# Patient Record
Sex: Male | Born: 1970 | Race: White | Smoking: Former smoker
Health system: Southern US, Community
[De-identification: ages and names within clinical notes are randomized; demographics above are authoritative.]

## PROBLEM LIST (undated history)

## (undated) DIAGNOSIS — F909 Attention-deficit hyperactivity disorder, unspecified type: Secondary | ICD-10-CM

## (undated) HISTORY — DX: Attention-deficit hyperactivity disorder, unspecified type: F90.9

---

## 2015-01-14 ENCOUNTER — Ambulatory Visit (INDEPENDENT_AMBULATORY_CARE_PROVIDER_SITE_OTHER): Payer: 59 | Admitting: Psychology

## 2015-01-14 DIAGNOSIS — F4310 Post-traumatic stress disorder, unspecified: Secondary | ICD-10-CM

## 2015-01-23 ENCOUNTER — Ambulatory Visit (INDEPENDENT_AMBULATORY_CARE_PROVIDER_SITE_OTHER): Payer: 59 | Admitting: Psychology

## 2015-01-23 DIAGNOSIS — F4310 Post-traumatic stress disorder, unspecified: Secondary | ICD-10-CM | POA: Diagnosis not present

## 2015-02-07 ENCOUNTER — Ambulatory Visit (INDEPENDENT_AMBULATORY_CARE_PROVIDER_SITE_OTHER): Payer: 59 | Admitting: Psychology

## 2015-02-07 DIAGNOSIS — F908 Attention-deficit hyperactivity disorder, other type: Secondary | ICD-10-CM | POA: Diagnosis not present

## 2015-02-07 DIAGNOSIS — F3340 Major depressive disorder, recurrent, in remission, unspecified: Secondary | ICD-10-CM

## 2015-02-22 ENCOUNTER — Ambulatory Visit (INDEPENDENT_AMBULATORY_CARE_PROVIDER_SITE_OTHER): Payer: 59 | Admitting: Psychology

## 2015-02-22 DIAGNOSIS — F3342 Major depressive disorder, recurrent, in full remission: Secondary | ICD-10-CM | POA: Diagnosis not present

## 2015-02-22 DIAGNOSIS — F1020 Alcohol dependence, uncomplicated: Secondary | ICD-10-CM

## 2015-02-22 DIAGNOSIS — F902 Attention-deficit hyperactivity disorder, combined type: Secondary | ICD-10-CM | POA: Diagnosis not present

## 2015-02-22 DIAGNOSIS — F602 Antisocial personality disorder: Secondary | ICD-10-CM

## 2015-10-03 ENCOUNTER — Ambulatory Visit (INDEPENDENT_AMBULATORY_CARE_PROVIDER_SITE_OTHER): Payer: 59 | Admitting: Psychology

## 2015-10-03 DIAGNOSIS — F4310 Post-traumatic stress disorder, unspecified: Secondary | ICD-10-CM | POA: Diagnosis not present

## 2015-10-29 ENCOUNTER — Encounter: Payer: Self-pay | Admitting: Family

## 2015-10-29 ENCOUNTER — Ambulatory Visit (INDEPENDENT_AMBULATORY_CARE_PROVIDER_SITE_OTHER): Payer: 59 | Admitting: Family

## 2015-10-29 VITALS — BP 118/82 | HR 83 | Temp 98.7°F | Resp 18 | Ht 72.0 in | Wt 163.4 lb

## 2015-10-29 DIAGNOSIS — F909 Attention-deficit hyperactivity disorder, unspecified type: Secondary | ICD-10-CM | POA: Insufficient documentation

## 2015-10-29 DIAGNOSIS — F902 Attention-deficit hyperactivity disorder, combined type: Secondary | ICD-10-CM

## 2015-10-29 DIAGNOSIS — F17200 Nicotine dependence, unspecified, uncomplicated: Secondary | ICD-10-CM | POA: Diagnosis not present

## 2015-10-29 DIAGNOSIS — M7989 Other specified soft tissue disorders: Secondary | ICD-10-CM | POA: Insufficient documentation

## 2015-10-29 MED ORDER — AMPHETAMINE-DEXTROAMPHETAMINE 5 MG PO TABS: 5.0000 mg | ORAL_TABLET | Freq: Every day | ORAL | Status: DC

## 2015-10-29 NOTE — Assessment & Plan Note (Signed)
Discussed continued use of tobacco and increased risk for cardiovascular and pulmonary disease. Not ready to quit at this time. Did receive motivation finding out he has a significant family history of cancer. Continue to monitor and follow up for additional counseling with annual visit.

## 2015-10-29 NOTE — Assessment & Plan Note (Signed)
Symptoms consistent with soft tissue cyst most likely benign, however there is concern for underlying pathology. Obtain ultrasound. Continue to monitor pending ultrasound. Follow up if symptoms worsen.

## 2015-10-29 NOTE — Assessment & Plan Note (Signed)
Recently diagnosed with ADHD through [facility]. Discussed risks and benefits of stimulant and non-stimulant medications. Start Adderall. [place] Controlled Substance Database reviewed with no irregularities. Follow up in 1 month. Due for UDS and Controlled Substance Contract.

## 2015-10-29 NOTE — Progress Notes (Signed)
Subjective:    Patient ID: Eugene Ryan, male    DOB: 07/01/1971, 45 y.o.   MRN: [id number]  Chief Complaint  Patient presents with  . Establish Care    cyst, ADHD    HPI:  Eugene Ryan is a 45 y.o. male who  has a past medical history of ADHD (attention deficit hyperactivity disorder). and presents today for an office visit to establish care.  1.) Cyst - Associated symptom of a cyst located on his upper right quadrant has been going on for about 10 years. Describes that it is sitting on his rib cage and has noted some growth over the last several months with pressure and is sensitive to touch. Pain is described as dull. Previously seen about 10 years ago and was instructed to monitor.   2.) ADHD - Last year completed ADHD testing through [facility] and found to have ADHD. Currently working with Dr. Cheryln Manly. Describes trouble focusing on his job and completing tasks. Notes that his mind is constantly wandering. Tends to change tasks before completing the task. Has tried another person's prescription of Adderall.    No Known Allergies   No outpatient prescriptions prior to visit.   No facility-administered medications prior to visit.     Past Medical History  Diagnosis Date  . ADHD (attention deficit hyperactivity disorder)      History reviewed. No pertinent past surgical history.   Family History  Problem Relation Age of Onset  . Adopted: Yes  . Schizophrenia Mother   . Post-traumatic stress disorder Father   . Lung cancer Father      Social History   Social History  . Marital Status: Married    Spouse Name: N/A  . Number of Children: 1  . Years of Education: 14   Occupational History  . Media planner    Social History Main Topics  . Smoking status: Current Every Day Smoker -- 0.75 packs/day for 26 years    Types: Cigarettes  . Smokeless tobacco: Never Used  . Alcohol Use: 7.2 oz/week    0 Standard drinks or equivalent, 12 Cans of  beer per week  . Drug Use: No  . Sexual Activity: Not on file   Other Topics Concern  . Not on file   Social History Narrative     Review of Systems  Constitutional: Negative for fever, chills and unexpected weight change.  Respiratory: Positive for chest tightness. Negative for cough and shortness of breath.   Skin:       Positive for a cyst  Psychiatric/Behavioral: Positive for decreased concentration. Negative for suicidal ideas, confusion, sleep disturbance and dysphoric mood. The patient is not hyperactive.       Objective:    BP 118/82 mmHg  Pulse 83  Temp(Src) 98.7 F (37.1 C) (Oral)  Resp 18  Ht 6' (1.829 m)  Wt 163 lb 6.4 oz (74.118 kg)  BMI 22.16 kg/m2  SpO2 97% Nursing note and vital signs reviewed.  Physical Exam  Constitutional: He is oriented to person, place, and time. He appears well-developed and well-nourished. No distress.  Cardiovascular: Normal rate, regular rhythm, normal heart sounds and intact distal pulses.   Pulmonary/Chest: Effort normal and breath sounds normal.    Neurological: He is alert and oriented to person, place, and time.  Skin: Skin is warm and dry.  Psychiatric: He has a normal mood and affect. His behavior is normal. Judgment and thought content normal.       Assessment &  Plan:   Problem List Items Addressed This Visit      Other   Attention deficit hyperactivity disorder (ADHD) - Primary    Recently diagnosed with ADHD through [place] [facility]. Discussed risks and benefits of stimulant and non-stimulant medications. Start Adderall. [place] Controlled Substance Database reviewed with no irregularities. Follow up in 1 month. Due for UDS and Controlled Substance Contract.       Cyst of soft tissue    Symptoms consistent with soft tissue cyst most likely benign, however there is concern for underlying pathology. Obtain ultrasound. Continue to monitor pending ultrasound. Follow up if symptoms worsen.       Relevant Orders   [place]  Chest   Tobacco use disorder    Discussed continued use of tobacco and increased risk for cardiovascular and pulmonary disease. Not ready to quit at this time. Did receive motivation finding out he has a significant family history of cancer. Continue to monitor and follow up for additional counseling with annual visit.

## 2015-10-29 NOTE — Progress Notes (Signed)
Pre visit review using our clinic review tool, if applicable. No additional management support is needed unless otherwise documented below in the visit note. 

## 2015-10-29 NOTE — Patient Instructions (Signed)
Thank you for choosing [facility].  Summary/Instructions:  Your prescription(s) have been submitted to your pharmacy or been printed and provided for you. Please take as directed and contact our office if you believe you are having problem(s) with the medication(s) or have any questions.  If your symptoms worsen or fail to improve, please contact our office for further instruction, or in case of emergency go directly to the emergency room at the closest medical facility.    Attention Deficit Hyperactivity Disorder Attention deficit hyperactivity disorder (ADHD) is a problem with behavior issues based on the way the brain functions (neurobehavioral disorder). It is a common reason for behavior and academic problems in school. SYMPTOMS  There are 3 types of ADHD. The 3 types and some of the symptoms include:  Inattentive.  Gets bored or distracted easily.  Loses or forgets things. Forgets to hand in homework.  Has trouble organizing or completing tasks.  Difficulty staying on task.  An inability to organize daily tasks and school work.  Leaving projects, chores, or homework unfinished.  Trouble paying attention or responding to details. Careless mistakes.  Difficulty following directions. Often seems like is not listening.  Dislikes activities that require sustained attention (like chores or homework).  Hyperactive-impulsive.  Feels like it is impossible to sit still or stay in a seat. Fidgeting with hands and feet.  Trouble waiting turn.  Talking too much or out of turn. Interruptive.  Speaks or acts impulsively.  Aggressive, disruptive behavior.  Constantly busy or on the go; noisy.  Often leaves seat when they are expected to remain seated.  Often runs or climbs where it is not appropriate, or feels very restless.  Combined.  Has symptoms of both of the above. Often children with ADHD feel discouraged about themselves and with school. They often perform  well below their abilities in school. As children get older, the excess motor activities can calm down, but the problems with paying attention and staying organized persist. Most children do not outgrow ADHD but with good treatment can learn to cope with the symptoms. DIAGNOSIS  When ADHD is suspected, the diagnosis should be made by professionals trained in ADHD. This professional will collect information about the individual suspected of having ADHD. Information must be collected from various settings where the person lives, works, or attends school.  Diagnosis will include:  Confirming symptoms began in childhood.  Ruling out other reasons for the child's behavior.  The health care providers will check with the child's school and check their medical records.  They will talk to teachers and parents.  Behavior rating scales for the child will be filled out by those dealing with the child on a daily basis. A diagnosis is made only after all information has been considered. TREATMENT  Treatment usually includes behavioral treatment, tutoring or extra support in school, and stimulant medicines. Because of the way a person's brain works with ADHD, these medicines decrease impulsivity and hyperactivity and increase attention. This is different than how they would work in a person who does not have ADHD. Other medicines used include antidepressants and certain blood pressure medicines. Most experts agree that treatment for ADHD should address all aspects of the person's functioning. Along with medicines, treatment should include structured classroom management at school. Parents should reward good behavior, provide constant discipline, and set limits. Tutoring should be available for the child as needed. ADHD is a lifelong condition. If untreated, the disorder can have long-term serious effects into adolescence and adulthood.  HOME CARE INSTRUCTIONS   Often with ADHD there is a lot of frustration  among family members dealing with the condition. Blame and anger are also feelings that are common. In many cases, because the problem affects the family as a whole, the entire family may need help. A therapist can help the family find better ways to handle the disruptive behaviors of the person with ADHD and promote change. If the person with ADHD is young, most of the therapist's work is with the parents. Parents will learn techniques for coping with and improving their child's behavior. Sometimes only the child with the ADHD needs counseling. Your health care providers can help you make these decisions.  Children with ADHD may need help learning how to organize. Some helpful tips include:  Keep routines the same every day from wake-up time to bedtime. Schedule all activities, including homework and playtime. Keep the schedule in a place where the person with ADHD will often see it. Jafeth schedule changes as far in advance as possible.  Schedule outdoor and indoor recreation.  Have a place for everything and keep everything in its place. This includes clothing, backpacks, and school supplies.  Encourage writing down assignments and bringing home needed books. Work with your child's teachers for assistance in organizing school work.  Offer your child a well-balanced diet. Breakfast that includes a balance of whole grains, protein, and fruits or vegetables is especially important for school performance. Children should avoid drinks with caffeine including:  Soft drinks.  Coffee.  Tea.  However, some older children (adolescents) may find these drinks helpful in improving their attention. Because it can also be common for adolescents with ADHD to become addicted to caffeine, talk with your health care provider about what is a safe amount of caffeine intake for your child.  Children with ADHD need consistent rules that they can understand and follow. If rules are followed, give small rewards.  Children with ADHD often receive, and expect, criticism. Look for good behavior and praise it. Set realistic goals. Give clear instructions. Look for activities that can foster success and self-esteem. Make time for pleasant activities with your child. Give lots of affection.  Parents are their children's greatest advocates. Learn as much as possible about ADHD. This helps you become a stronger and better advocate for your child. It also helps you educate your child's teachers and instructors if they feel inadequate in these areas. Parent support groups are often helpful. A national group with local chapters is called Children and Adults with Attention Deficit Hyperactivity Disorder (CHADD). SEEK MEDICAL CARE IF:  Your child has repeated muscle twitches, cough, or speech outbursts.  Your child has sleep problems.  Your child has a marked loss of appetite.  Your child develops depression.  Your child has new or worsening behavioral problems.  Your child develops dizziness.  Your child has a racing heart.  Your child has stomach pains.  Your child develops headaches. SEEK IMMEDIATE MEDICAL CARE IF:  Your child has been diagnosed with depression or anxiety and the symptoms seem to be getting worse.  Your child has been depressed and suddenly appears to have increased energy or motivation.  You are worried that your child is having a bad reaction to a medication he or she is taking for ADHD.   This information is not intended to replace advice given to you by your health care provider. Make sure you discuss any questions you have with your health care provider.   Document  Released: 08/28/2002 Document Revised: 09/12/2013 Document Reviewed: 05/15/2013 Elsevier Interactive Patient Education 2016 Elsevier Inc.  Amphetamine; Dextroamphetamine tablets What is this medicine? AMPHETAMINE; DEXTROAMPHETAMINE(am FET a meen; dex troe am FET a meen) is used to treat attention-deficit  hyperactivity disorder (ADHD). It may also be used for narcolepsy. Federal law prohibits giving this medicine to any person other than the person for whom it was prescribed. Do not share this medicine with anyone else. This medicine may be used for other purposes; ask your health care provider or pharmacist if you have questions. What should I tell my health care provider before I take this medicine? They need to know if you have any of these conditions: -anxiety or panic attacks -circulation problems in fingers and toes -glaucoma -hardening or blockages of the arteries or heart blood vessels -heart disease or a heart defect -high blood pressure -history of a drug or alcohol abuse problem -history of stroke -kidney disease -liver disease -mental illness -seizures -suicidal thoughts, plans, or attempt; a previous suicide attempt by you or a family member -thyroid disease -Tourette's syndrome -an unusual or allergic reaction to dextroamphetamine, other amphetamines, other medicines, foods, dyes, or preservatives -pregnant or trying to get pregnant -breast-feeding How should I use this medicine? Take this medicine by mouth with a glass of water. Follow the directions on the prescription label. Take your doses at regular intervals. Do not take your medicine more often than directed. Do not suddenly stop your medicine. You must gradually reduce the dose or you may feel withdrawal effects. Ask your doctor or health care professional for advice. Talk to your pediatrician regarding the use of this medicine in children. Special care may be needed. While this drug may be prescribed for children as young as 3 years for selected conditions, precautions do apply. Overdosage: If you think you have taken too much of this medicine contact a poison control center or emergency room at once. NOTE: This medicine is only for you. Do not share this medicine with others. What if I miss a dose? If you miss a  dose, take it as soon as you can. If it is almost time for your next dose, take only that dose. Do not take double or extra doses. What may interact with this medicine? Do not take this medicine with any of the following medications: -MAOIS like Carbex, Eldepryl, Marplan, Nardil, and Parnate -other stimulant medicines for attention disorders, weight loss, or to stay awake This medicine may also interact with the following medications: -acetazolamide -ammonium chloride -antacids -ascorbic acid -atomoxetine -caffeine -certain medicines for blood pressure -certain medicines for depression, anxiety, or psychotic disturbances -certain medicines for seizures like carbamazepine, phenobarbital, phenytoin -certain medicines for stomach problems like cimetidine, famotidine, omeprazole, lansoprazole -cold or allergy medicines -glutamic acid -lithium -meperidine -methenamine; sodium acid phosphate -narcotic medicines for pain -norepinephrine -phenothiazines like chlorpromazine, mesoridazine, prochlorperazine, thioridazine -sodium acid phosphate -sodium bicarbonate This list may not describe all possible interactions. Give your health care provider a list of all the medicines, herbs, non-prescription drugs, or dietary supplements you use. Also tell them if you smoke, drink alcohol, or use illegal drugs. Some items may interact with your medicine. What should I watch for while using this medicine? Visit your doctor or health care professional for regular checks on your progress. This prescription requires that you follow special procedures with your doctor and pharmacy. You will need to have a new written prescription from your doctor every time you need a refill. This medicine may affect  your concentration, or hide signs of tiredness. Until you know how this medicine affects you, do not drive, ride a bicycle, use machinery, or do anything that needs mental alertness. Tell your doctor or health care  professional if this medicine loses its effects, or if you feel you need to take more than the prescribed amount. Do not change the dosage without talking to your doctor or health care professional. Decreased appetite is a common side effect when starting this medicine. Eating small, frequent meals or snacks can help. Talk to your doctor if you continue to have poor eating habits. Height and weight growth of a child taking this medicine will be monitored closely. Do not take this medicine close to bedtime. It may prevent you from sleeping. If you are going to need surgery, a MRI, CT scan, or other procedure, tell your doctor that you are taking this medicine. You may need to stop taking this medicine before the procedure. Tell your doctor or healthcare professional right away if you notice unexplained wounds on your fingers and toes while taking this medicine. You should also tell your healthcare provider if you experience numbness or pain, changes in the skin color, or sensitivity to temperature in your fingers or toes. What side effects may I notice from receiving this medicine? Side effects that you should report to your doctor or health care professional as soon as possible: -allergic reactions like skin rash, itching or hives, swelling of the face, lips, or tongue -changes in vision -chest pain or chest tightness -confusion, trouble speaking or understanding -fast, irregular heartbeat -fingers or toes feel numb, cool, painful -hallucination, loss of contact with reality -high blood pressure -males: prolonged or painful erection -seizures -severe headaches -shortness of breath -suicidal thoughts or other mood changes -trouble walking, dizziness, loss of balance or coordination -uncontrollable head, mouth, neck, arm, or leg movements Side effects that usually do not require medical attention (report to your doctor or health care professional if they continue or are  bothersome): -anxious -headache -loss of appetite -nausea, vomiting -trouble sleeping -weight loss This list may not describe all possible side effects. Call your doctor for medical advice about side effects. You may report side effects to FDA at 1-800-FDA-1088. Where should I keep my medicine? Keep out of the reach of children. This medicine can be abused. Keep your medicine in a safe place to protect it from theft. Do not share this medicine with anyone. Selling or giving away this medicine is dangerous and against the law. Store at room temperature between 15 and 30 degrees C (59 and 86 degrees F). Keep container tightly closed. Throw away any unused medicine after the expiration date. Dispose of properly. This medicine may cause accidental overdose and death if it is taken by other adults, children, or pets. Mix any unused medicine with a substance like cat litter or coffee grounds. Then throw the medicine away in a sealed container like a sealed bag or a coffee can with a lid. Do not use the medicine after the expiration date. NOTE: This sheet is a summary. It may not cover all possible information. If you have questions about this medicine, talk to your doctor, pharmacist, or health care provider.    2016, Elsevier/Gold Standard. (2014-07-11 18:44:41)

## 2015-11-19 ENCOUNTER — Ambulatory Visit
Admission: RE | Admit: 2015-11-19 | Discharge: 2015-11-19 | Disposition: A | Discharge status: Home / Self Care | Payer: 59 | Source: Ambulatory Visit | Attending: Family | Admitting: Family

## 2015-11-19 DIAGNOSIS — R222 Localized swelling, mass and lump, trunk: Secondary | ICD-10-CM | POA: Diagnosis not present

## 2015-11-19 DIAGNOSIS — M7989 Other specified soft tissue disorders: Secondary | ICD-10-CM

## 2015-11-20 ENCOUNTER — Telehealth: Payer: Self-pay | Admitting: Family

## 2015-11-20 DIAGNOSIS — M7989 Other specified soft tissue disorders: Secondary | ICD-10-CM

## 2015-11-20 NOTE — Telephone Encounter (Signed)
Please inform patient that his ultrasound shows a solid mass with no cystic abnormality identified. Radiologist recommends follow-up with MRI to further investigate. If he is interested we'll place the order for the MRI.

## 2015-11-21 NOTE — Telephone Encounter (Signed)
LVM for pt to call back as soon as possible.   RE: results.  

## 2015-11-22 NOTE — Telephone Encounter (Signed)
Pt aware of results. Does wants and MRI done. Please advise on which MRI

## 2015-11-25 NOTE — Telephone Encounter (Signed)
Pt is aware.  

## 2015-11-25 NOTE — Telephone Encounter (Signed)
MRI order will be placed and will need to stop by the lab to check his kidney function prior to the test. Can be done at any time and no fasting needed.

## 2015-11-25 NOTE — Addendum Note (Signed)
Addended by: Mauricio Po D on: 11/25/2015 12:58 PM   Modules accepted: Orders

## 2015-12-03 ENCOUNTER — Inpatient Hospital Stay: Admission: RE | Admit: 2015-12-03 | Payer: 59 | Source: Ambulatory Visit

## 2015-12-11 ENCOUNTER — Telehealth: Payer: Self-pay | Admitting: Family

## 2015-12-11 ENCOUNTER — Ambulatory Visit
Admission: RE | Admit: 2015-12-11 | Discharge: 2015-12-11 | Disposition: A | Discharge status: Home / Self Care | Payer: 59 | Source: Ambulatory Visit | Attending: Family | Admitting: Family

## 2015-12-11 DIAGNOSIS — R222 Localized swelling, mass and lump, trunk: Secondary | ICD-10-CM | POA: Diagnosis not present

## 2015-12-11 DIAGNOSIS — M7989 Other specified soft tissue disorders: Secondary | ICD-10-CM

## 2015-12-11 MED ORDER — GADOBENATE DIMEGLUMINE 529 MG/ML IV SOLN
12.0000 mL | Freq: Once | INTRAVENOUS | Status: AC | PRN
  Administered 2015-12-11: 12 mL via INTRAVENOUS

## 2015-12-11 NOTE — Telephone Encounter (Signed)
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 No further treatment is needed at this time and he can follow up for any changes in size.

## 2015-12-13 NOTE — Telephone Encounter (Signed)
Pt aware of results 

## 2017-08-30 IMAGING — US US CHEST/MEDIASTINUM
1 series · 6 of 6 positions shown · non-contrast
Comparison: None.

CLINICAL DATA: Right ribs cyst.

EXAM:
CHEST ULTRASOUND

[Series 1: us chest/mediastinum · 0.05mm/px · 6 of 6 slices shown]
[im 1/6]
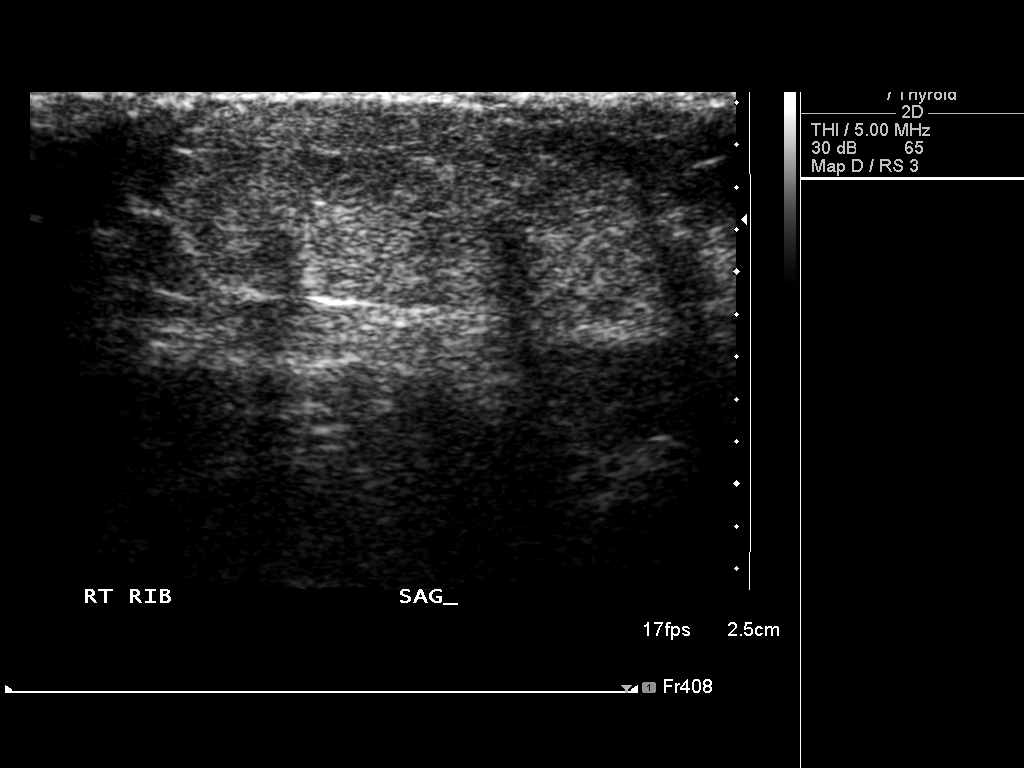
[im 2/6]
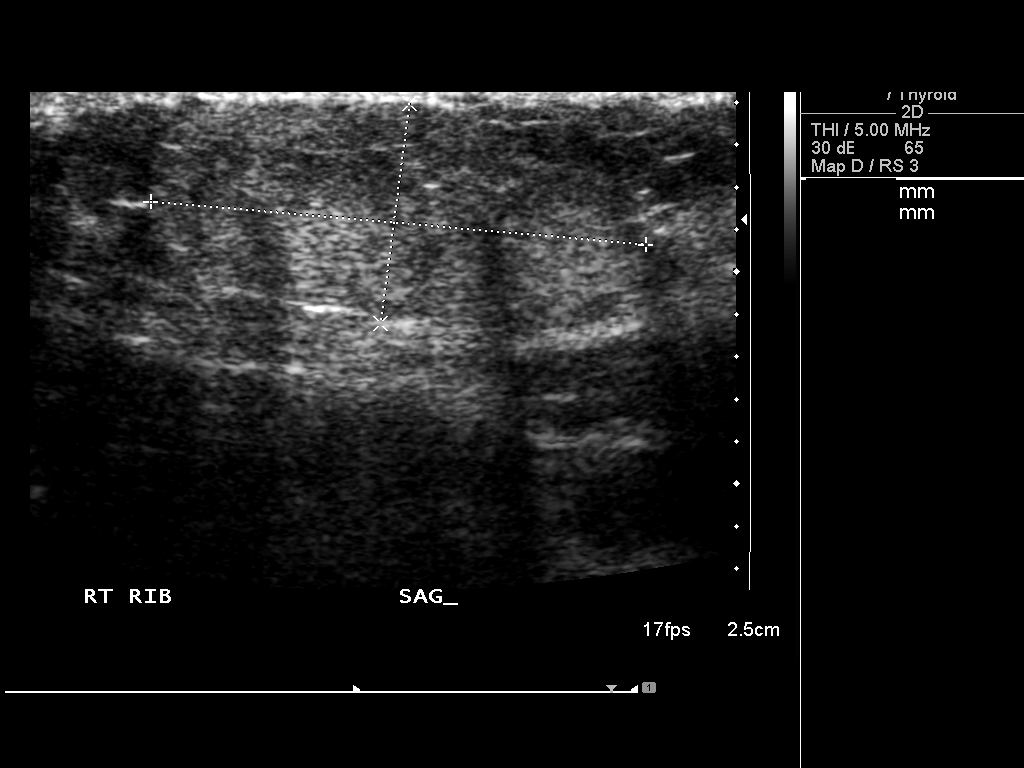
[im 3/6]
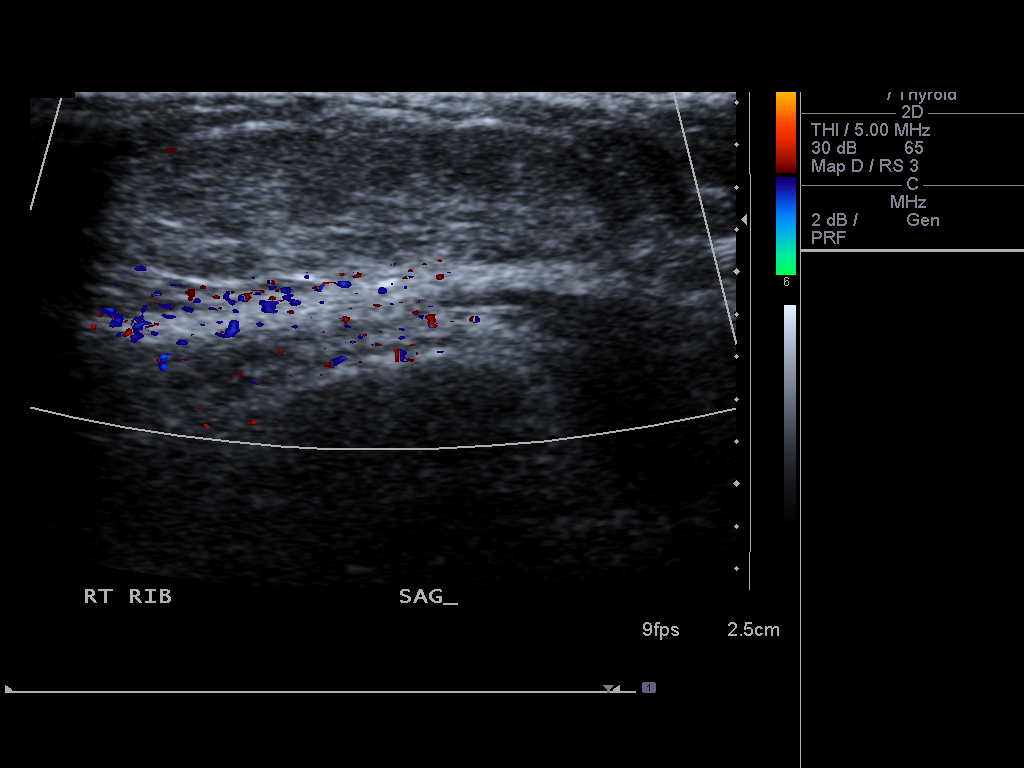
[im 4/6]
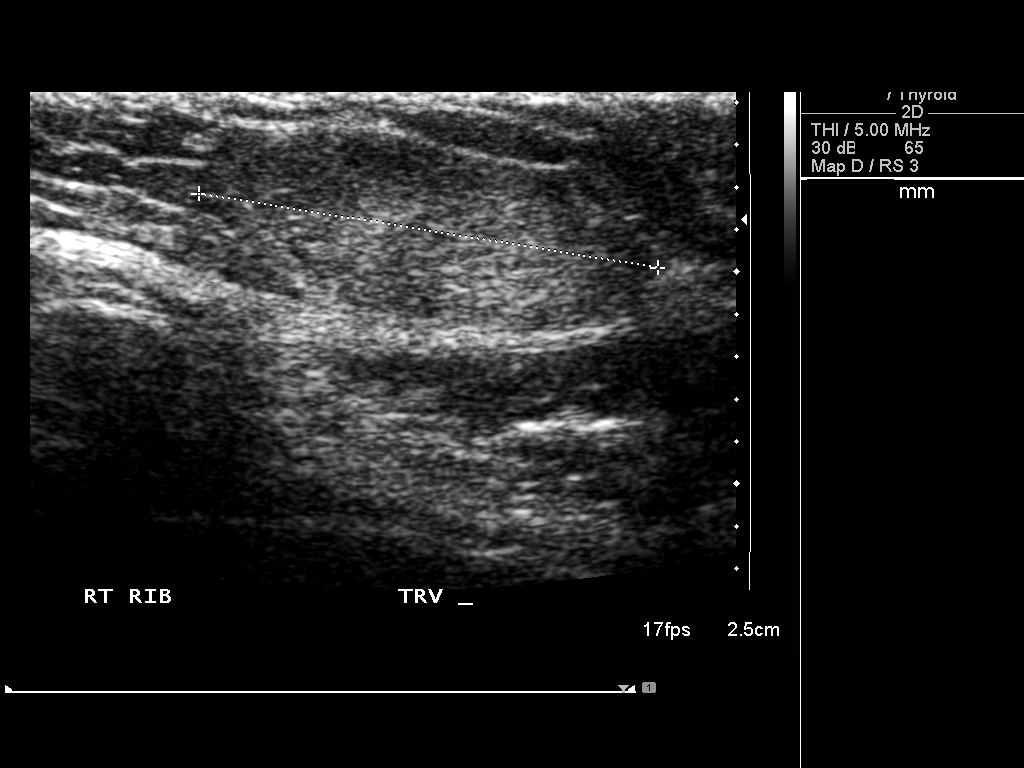
[im 5/6]
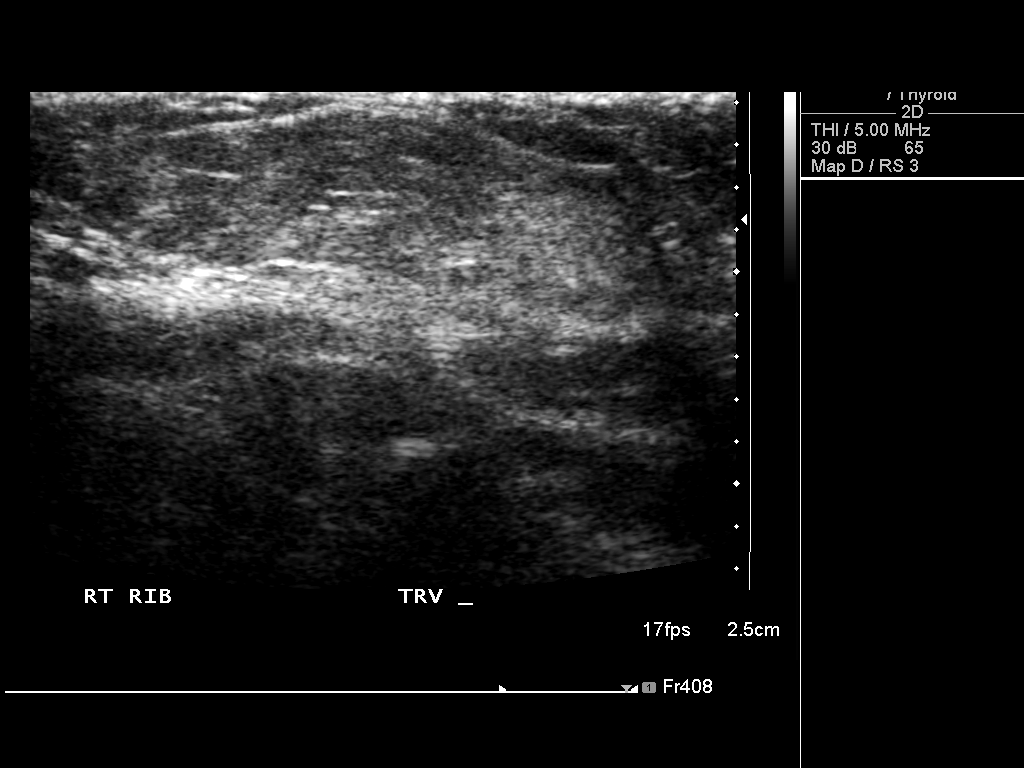
[im 6/6]
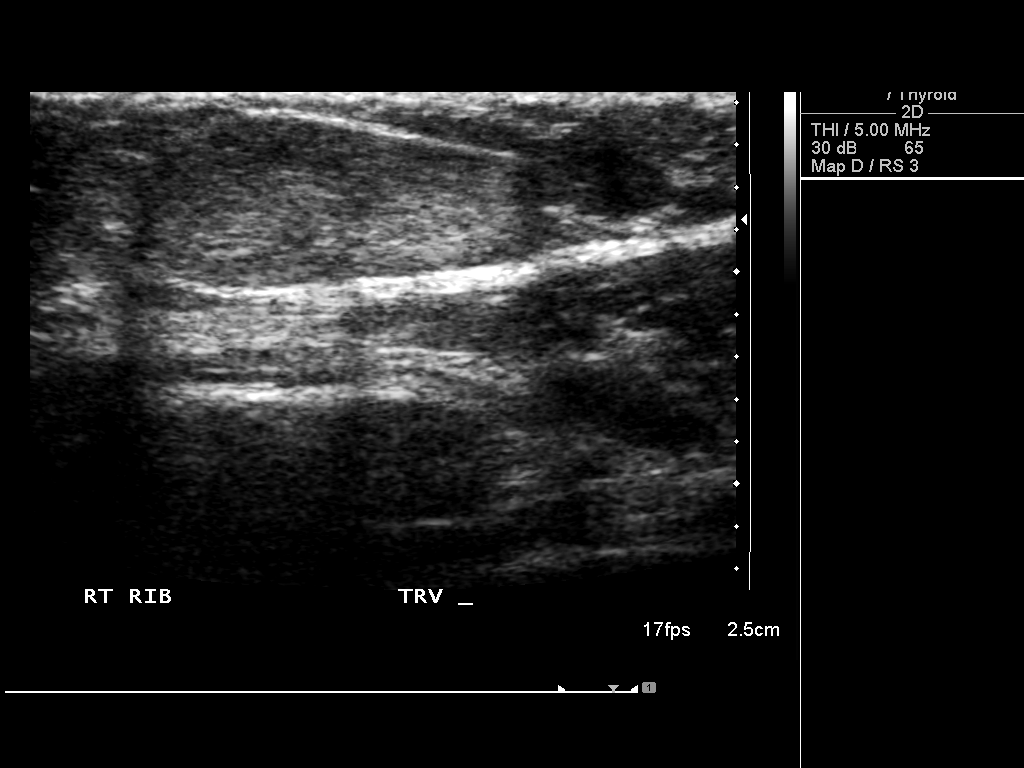

[6 of 6 positions shown; findings below may reference images not displayed]

FINDINGS: 2.3 x 1.1 x 2.2 cm heterogeneous solid mass is noted about the right
chest wall in the region of palpable abnormality.
Gadolinium-enhanced MRI of the chest is suggested for further
evaluation of the chest wall.
IMPRESSION: 2.3 x 1.1 x 2.2 cm heterogeneous solid mass noted about the right
chest wall in the region of palpable abnormality. No cystic
abnormalities identified. Gadolinium-enhanced MRI of the chest is
suggested for further evaluation of the chest wall and this lesion.

## 2018-01-18 ENCOUNTER — Ambulatory Visit (INDEPENDENT_AMBULATORY_CARE_PROVIDER_SITE_OTHER): Payer: 59 | Admitting: Emergency Medicine

## 2018-01-18 ENCOUNTER — Encounter: Payer: Self-pay | Admitting: Emergency Medicine

## 2018-01-18 VITALS — BP 122/74 | HR 62 | Temp 98.6°F | Resp 17 | Ht 71.5 in | Wt 154.0 lb

## 2018-01-18 DIAGNOSIS — Z0000 Encounter for general adult medical examination without abnormal findings: Secondary | ICD-10-CM

## 2018-01-18 DIAGNOSIS — W57XXXA Bitten or stung by nonvenomous insect and other nonvenomous arthropods, initial encounter: Secondary | ICD-10-CM | POA: Diagnosis not present

## 2018-01-18 NOTE — Patient Instructions (Addendum)
   IF you received an x-ray today, you will receive an invoice from [facility]. Please contact [facility] at [phone] with questions or concerns regarding your invoice.   IF you received labwork today, you will receive an invoice from [facility]. Please contact [facility] at [phone] with questions or concerns regarding your invoice.   Our billing staff will not be able to assist you with questions regarding bills from these companies.  You will be contacted with the lab results as soon as they are available. The fastest way to get your results is to activate your My Chart account. Instructions are located on the last page of this paperwork. If you have not heard from us regarding the results in 2 weeks, please contact this office.      Health Maintenance, Male A healthy lifestyle and preventive care is important for your health and wellness. Ask your health care provider about what schedule of regular examinations is right for you. What should I know about weight and diet? Eat a Healthy Diet  Eat plenty of vegetables, fruits, whole grains, low-fat dairy products, and lean protein.  Do not eat a lot of foods high in solid fats, added sugars, or salt.  Maintain a Healthy Weight Regular exercise can help you achieve or maintain a healthy weight. You should:  Do at least 150 minutes of exercise each week. The exercise should increase your heart rate and make you sweat (moderate-intensity exercise).  Do strength-training exercises at least twice a week.  Watch Your Levels of Cholesterol and Blood Lipids  Have your blood tested for lipids and cholesterol every 5 years starting at 47 years of age. If you are at high risk for heart disease, you should start having your blood tested when you are 47 years old. You may need to have your cholesterol levels checked more often if: ? Your lipid or cholesterol levels are high. ? You are older than 47 years of age. ? You  are at high risk for heart disease.  What should I know about cancer screening? Many types of cancers can be detected early and may often be prevented. Lung Cancer  You should be screened every year for lung cancer if: ? You are a current smoker who has smoked for at least 30 years. ? You are a former smoker who has quit within the past 15 years.  Talk to your health care provider about your screening options, when you should start screening, and how often you should be screened.  Colorectal Cancer  Routine colorectal cancer screening usually begins at 47 years of age and should be repeated every 5-10 years until you are 47 years old. You may need to be screened more often if early forms of precancerous polyps or small growths are found. Your health care provider may recommend screening at an earlier age if you have risk factors for colon cancer.  Your health care provider may recommend using home test kits to check for hidden blood in the stool.  A small camera at the end of a tube can be used to examine your colon (sigmoidoscopy or colonoscopy). This checks for the earliest forms of colorectal cancer.  Prostate and Testicular Cancer  Depending on your age and overall health, your health care provider may do certain tests to screen for prostate and testicular cancer.  Talk to your health care provider about any symptoms or concerns you have about testicular or prostate cancer.  Skin Cancer  Check your skin   from head to toe regularly.  Tell your health care provider about any new moles or changes in moles, especially if: ? There is a change in a mole's size, shape, or color. ? You have a mole that is larger than a pencil eraser.  Always use sunscreen. Apply sunscreen liberally and repeat throughout the day.  Protect yourself by wearing long sleeves, pants, a wide-brimmed hat, and sunglasses when outside.  What should I know about heart disease, diabetes, and high blood  pressure?  If you are 18-39 years of age, have your blood pressure checked every 3-5 years. If you are 40 years of age or older, have your blood pressure checked every year. You should have your blood pressure measured twice-once when you are at a hospital or clinic, and once when you are not at a hospital or clinic. Record the average of the two measurements. To check your blood pressure when you are not at a hospital or clinic, you can use: ? An automated blood pressure machine at a pharmacy. ? A home blood pressure monitor.  Talk to your health care provider about your target blood pressure.  If you are between 45-79 years old, ask your health care provider if you should take aspirin to prevent heart disease.  Have regular diabetes screenings by checking your fasting blood sugar level. ? If you are at a normal weight and have a low risk for diabetes, have this test once every three years after the age of 45. ? If you are overweight and have a high risk for diabetes, consider being tested at a younger age or more often.  A one-time screening for abdominal aortic aneurysm (AAA) by ultrasound is recommended for men aged 65-75 years who are current or former smokers. What should I know about preventing infection? Hepatitis B If you have a higher risk for hepatitis B, you should be screened for this virus. Talk with your health care provider to find out if you are at risk for hepatitis B infection. Hepatitis C Blood testing is recommended for:  Everyone born from 1945 through 1965.  Anyone with known risk factors for hepatitis C.  Sexually Transmitted Diseases (STDs)  You should be screened each year for STDs including gonorrhea and chlamydia if: ? You are sexually active and are younger than 47 years of age. ? You are older than 47 years of age and your health care provider tells you that you are at risk for this type of infection. ? Your sexual activity has changed since you were last  screened and you are at an increased risk for chlamydia or gonorrhea. Ask your health care provider if you are at risk.  Talk with your health care provider about whether you are at high risk of being infected with HIV. Your health care provider may recommend a prescription medicine to help prevent HIV infection.  What else can I do?  Schedule regular health, dental, and eye exams.  Stay current with your vaccines (immunizations).  Do not use any tobacco products, such as cigarettes, chewing tobacco, and e-cigarettes. If you need help quitting, ask your health care provider.  Limit alcohol intake to no more than 2 drinks per day. One drink equals 12 ounces of beer, 5 ounces of wine, or 1 ounces of hard liquor.  Do not use street drugs.  Do not share needles.  Ask your health care provider for help if you need support or information about quitting drugs.  Tell your health care   provider if you often feel depressed.  Tell your health care provider if you have ever been abused or do not feel safe at home. This information is not intended to replace advice given to you by your health care provider. Make sure you discuss any questions you have with your health care provider. Document Released: 03/05/2008 Document Revised: 05/06/2016 Document Reviewed: 06/11/2015 Elsevier Interactive Patient Education  2018 Elsevier Inc.  

## 2018-01-18 NOTE — Progress Notes (Signed)
Eugene Ryan 47 y.o.   Chief Complaint  Patient presents with  . Establish Care    per patient to check for LYME DISEASE - bitten by a tick on LEFT inner thigh 01/17/2018    HISTORY OF PRESENT ILLNESS: This is a 47 y.o. male Here for annual exam; no complaints and no medical concerns. Also had recent tick bites but no attachment longer than 24 hours.  HPI   Prior to Admission medications   Medication Sig Start Date End Date Taking? Authorizing Provider  amphetamine-dextroamphetamine (ADDERALL) 5 MG tablet Take 1 tablet (5 mg total) by mouth daily. Patient not taking: Reported on 01/18/2018 10/29/15   Golden Circle, FNP    No Known Allergies  Patient Active Problem List   Diagnosis Date Noted  . Attention deficit hyperactivity disorder (ADHD) 10/29/2015  . Cyst of soft tissue 10/29/2015  . Tobacco use disorder 10/29/2015    Past Medical History:  Diagnosis Date  . ADHD (attention deficit hyperactivity disorder)     No past surgical history on file.  Social History   Socioeconomic History  . Marital status: Married    Spouse name: Not on file  . Number of children: 1  . Years of education: 88  . Highest education level: Not on file  Occupational History  . Occupation: Media planner  Social Needs  . Financial resource strain: Not on file  . Food insecurity:    Worry: Not on file    Inability: Not on file  . Transportation needs:    Medical: Not on file    Non-medical: Not on file  Tobacco Use  . Smoking status: Current Every Day Smoker    Packs/day: 0.75    Years: 26.00    Pack years: 19.50    Types: E-cigarettes  . Smokeless tobacco: Never Used  Substance and Sexual Activity  . Alcohol use: Yes    Alcohol/week: 7.2 oz    Types: 12 Cans of beer per week  . Drug use: No  . Sexual activity: Not on file  Lifestyle  . Physical activity:    Days per week: Not on file    Minutes per session: Not on file  . Stress: Not on file  Relationships  .  Social connections:    Talks on phone: Not on file    Gets together: Not on file    Attends religious service: Not on file    Active member of club or organization: Not on file    Attends meetings of clubs or organizations: Not on file    Relationship status: Not on file  . Intimate partner violence:    Fear of current or ex partner: Not on file    Emotionally abused: Not on file    Physically abused: Not on file    Forced sexual activity: Not on file  Other Topics Concern  . Not on file  Social History Narrative  . Not on file    Family History  Adopted: Yes  Problem Relation Age of Onset  . Schizophrenia Mother   . Post-traumatic stress disorder Father   . Lung cancer Father      Review of Systems  Constitutional: Negative.  Negative for chills, fever and weight loss.  HENT: Negative.  Negative for hearing loss and sore throat.   Eyes: Negative.  Negative for blurred vision and double vision.  Respiratory: Negative.  Negative for cough and shortness of breath.   Cardiovascular: Negative.  Negative for chest  pain, palpitations and leg swelling.  Gastrointestinal: Negative.  Negative for abdominal pain, diarrhea, nausea and vomiting.  Genitourinary: Negative.  Negative for dysuria and hematuria.  Musculoskeletal: Negative.  Negative for myalgias and neck pain.  Skin: Negative.   Neurological: Negative.  Negative for dizziness and headaches.  Endo/Heme/Allergies: Negative.   All other systems reviewed and are negative.    Vitals:   01/18/18 1647  BP: 122/74  Pulse: 62  Resp: 17  Temp: 98.6 F (37 C)  SpO2: 98%     Physical Exam  Constitutional: He is oriented to person, place, and time. He appears well-developed and well-nourished.  HENT:  Head: Normocephalic and atraumatic.  Right Ear: External ear normal.  Left Ear: External ear normal.  Nose: Nose normal.  Mouth/Throat: Oropharynx is clear and moist.  Eyes: Pupils are equal, round, and reactive to light.  Conjunctivae and EOM are normal.  Neck: Normal range of motion. Neck supple. No JVD present. No thyromegaly present.  Cardiovascular: Normal rate, regular rhythm and normal heart sounds.  No murmur heard. Pulmonary/Chest: Effort normal and breath sounds normal.  Abdominal: Soft. Bowel sounds are normal. He exhibits no distension. There is no tenderness.  Musculoskeletal: Normal range of motion. He exhibits no edema.  Lymphadenopathy:    He has no cervical adenopathy.  Neurological: He is alert and oriented to person, place, and time. No sensory deficit. He exhibits normal muscle tone. Coordination normal.  Skin: Capillary refill takes less than 2 seconds.  2 areas on each side of the upper inner thigh with erythema; sites of tick bites.  No infection.  Psychiatric: He has a normal mood and affect. His behavior is normal.  Vitals reviewed.    ASSESSMENT & PLAN: Deral was seen today for establish care.  Diagnoses and all orders for this visit:  Routine general medical examination at a health care facility -     CBC with Differential -     Comprehensive metabolic panel -     Hemoglobin A1c -     Lipid panel -     HIV antibody -     Lyme Ab/Western Blot Reflex  Tick bite, initial encounter -     Lyme Ab/Western Blot Reflex    Patient Instructions       IF you received an x-ray today, you will receive an invoice from [facility]. Please contact [facility] at [phone] with questions or concerns regarding your invoice.   IF you received labwork today, you will receive an invoice from [facility]. Please contact [facility] at [phone] with questions or concerns regarding your invoice.   Our billing staff will not be able to assist you with questions regarding bills from these companies.  You will be contacted with the lab results as soon as they are available. The fastest way to get your results is to activate your My Chart account. Instructions are located  on the last page of this paperwork. If you have not heard from [place] regarding the results in 2 weeks, please contact this office.       Health Maintenance, Male A healthy lifestyle and preventive care is important for your health and wellness. Ask your health care provider about what schedule of regular examinations is right for you. What should I know about weight and diet? Eat a Healthy Diet  Eat plenty of vegetables, fruits, whole grains, low-fat dairy products, and lean protein.  Do not eat a lot of foods high in solid fats, added sugars, or  salt.  Maintain a Healthy Weight Regular exercise can help you achieve or maintain a healthy weight. You should:  Do at least 150 minutes of exercise each week. The exercise should increase your heart rate and make you sweat (moderate-intensity exercise).  Do strength-training exercises at least twice a week.  Watch Your Levels of Cholesterol and Blood Lipids  Have your blood tested for lipids and cholesterol every 5 years starting at 47 years of age. If you are at high risk for heart disease, you should start having your blood tested when you are 46 years old. You may need to have your cholesterol levels checked more often if: ? Your lipid or cholesterol levels are high. ? You are older than 47 years of age. ? You are at high risk for heart disease.  What should I know about cancer screening? Many types of cancers can be detected early and may often be prevented. Lung Cancer  You should be screened every year for lung cancer if: ? You are a current smoker who has smoked for at least 30 years. ? You are a former smoker who has quit within the past 15 years.  Talk to your health care provider about your screening options, when you should start screening, and how often you should be screened.  Colorectal Cancer  Routine colorectal cancer screening usually begins at 47 years of age and should be repeated every 5-10 years until you are 47  years old. You may need to be screened more often if early forms of precancerous polyps or small growths are found. Your health care provider may recommend screening at an earlier age if you have risk factors for colon cancer.  Your health care provider may recommend using home test kits to check for hidden blood in the stool.  A small camera at the end of a tube can be used to examine your colon (sigmoidoscopy or colonoscopy). This checks for the earliest forms of colorectal cancer.  Prostate and Testicular Cancer  Depending on your age and overall health, your health care provider may do certain tests to screen for prostate and testicular cancer.  Talk to your health care provider about any symptoms or concerns you have about testicular or prostate cancer.  Skin Cancer  Check your skin from head to toe regularly.  Tell your health care provider about any new moles or changes in moles, especially if: ? There is a change in a mole's size, shape, or color. ? You have a mole that is larger than a pencil eraser.  Always use sunscreen. Apply sunscreen liberally and repeat throughout the day.  Protect yourself by wearing long sleeves, pants, a wide-brimmed hat, and sunglasses when outside.  What should I know about heart disease, diabetes, and high blood pressure?  If you are 57-34 years of age, have your blood pressure checked every 3-5 years. If you are 2 years of age or older, have your blood pressure checked every year. You should have your blood pressure measured twice-once when you are at a hospital or clinic, and once when you are not at a hospital or clinic. Record the average of the two measurements. To check your blood pressure when you are not at a hospital or clinic, you can use: ? An automated blood pressure machine at a pharmacy. ? A home blood pressure monitor.  Talk to your health care provider about your target blood pressure.  If you are between 33-80 years old, ask your  health care  provider if you should take aspirin to prevent heart disease.  Have regular diabetes screenings by checking your fasting blood sugar level. ? If you are at a normal weight and have a low risk for diabetes, have this test once every three years after the age of 109. ? If you are overweight and have a high risk for diabetes, consider being tested at a younger age or more often.  A one-time screening for abdominal aortic aneurysm (AAA) by ultrasound is recommended for men aged 80-75 years who are current or former smokers. What should I know about preventing infection? Hepatitis B If you have a higher risk for hepatitis B, you should be screened for this virus. Talk with your health care provider to find out if you are at risk for hepatitis B infection. Hepatitis C Blood testing is recommended for:  Everyone born from [birth year] through 1965.  Anyone with known risk factors for hepatitis C.  Sexually Transmitted Diseases (STDs)  You should be screened each year for STDs including gonorrhea and chlamydia if: ? You are sexually active and are younger than 47 years of age. ? You are older than 47 years of age and your health care provider tells you that you are at risk for this type of infection. ? Your sexual activity has changed since you were last screened and you are at an increased risk for chlamydia or gonorrhea. Ask your health care provider if you are at risk.  Talk with your health care provider about whether you are at high risk of being infected with HIV. Your health care provider may recommend a prescription medicine to help prevent HIV infection.  What else can I do?  Schedule regular health, dental, and eye exams.  Stay current with your vaccines (immunizations).  Do not use any tobacco products, such as cigarettes, chewing tobacco, and e-cigarettes. If you need help quitting, ask your health care provider.  Limit alcohol intake to no more than 2 drinks per day. One  drink equals 12 ounces of beer, 5 ounces of wine, or 1 ounces of hard liquor.  Do not use street drugs.  Do not share needles.  Ask your health care provider for help if you need support or information about quitting drugs.  Tell your health care provider if you often feel depressed.  Tell your health care provider if you have ever been abused or do not feel safe at home. This information is not intended to replace advice given to you by your health care provider. Make sure you discuss any questions you have with your health care provider. Document Released: 03/05/2008 Document Revised: 05/06/2016 Document Reviewed: 06/11/2015 Elsevier Interactive Patient Education  2018 Elsevier Inc.      Agustina Caroli, MD [facility]

## 2018-01-19 ENCOUNTER — Encounter: Payer: Self-pay | Admitting: Radiology

## 2018-01-19 LAB — HIV ANTIBODY (ROUTINE TESTING W REFLEX): HIV Screen 4th Generation wRfx: NONREACTIVE

## 2018-01-19 LAB — LIPID PANEL
CHOL/HDL RATIO: 3.1 ratio (ref 0.0–5.0)
CHOLESTEROL TOTAL: 190 mg/dL (ref 100–199)
HDL: 61 mg/dL (ref >39)
LDL CALC: 112 mg/dL — AB (ref 0–99)
TRIGLYCERIDES: 84 mg/dL (ref 0–149)
VLDL Cholesterol Cal: 17 mg/dL (ref 5–40)

## 2018-01-19 LAB — COMPREHENSIVE METABOLIC PANEL
ALK PHOS: 73 IU/L (ref 39–117)
ALT: 9 IU/L (ref 0–44)
AST: 14 IU/L (ref 0–40)
Albumin/Globulin Ratio: 1.8 (ref 1.2–2.2)
Albumin: 4.7 g/dL (ref 3.5–5.5)
BILIRUBIN TOTAL: 0.6 mg/dL (ref 0.0–1.2)
BUN/Creatinine Ratio: 9 (ref 9–20)
BUN: 9 mg/dL (ref 6–24)
CHLORIDE: 105 mmol/L (ref 96–106)
CO2: 21 mmol/L (ref 20–29)
CREATININE: 0.98 mg/dL (ref 0.76–1.27)
Calcium: 9.3 mg/dL (ref 8.7–10.2)
GFR calc Af Amer: 106 mL/min/{1.73_m2} (ref >59)
GFR calc non Af Amer: 92 mL/min/{1.73_m2} (ref >59)
GLUCOSE: 90 mg/dL (ref 65–99)
Globulin, Total: 2.6 g/dL (ref 1.5–4.5)
Potassium: 4.2 mmol/L (ref 3.5–5.2)
SODIUM: 142 mmol/L (ref 134–144)
Total Protein: 7.3 g/dL (ref 6.0–8.5)

## 2018-01-19 LAB — HEMOGLOBIN A1C
Est. average glucose Bld gHb Est-mCnc: 105 mg/dL
HEMOGLOBIN A1C: 5.3 % (ref 4.8–5.6)

## 2018-01-19 LAB — CBC WITH DIFFERENTIAL/PLATELET
BASOS ABS: 0 10*3/uL (ref 0.0–0.2)
Basos: 1 %
EOS (ABSOLUTE): 0.2 10*3/uL (ref 0.0–0.4)
Eos: 4 %
Hematocrit: 43.4 % (ref 37.5–51.0)
Hemoglobin: 15.6 g/dL (ref 13.0–17.7)
Immature Grans (Abs): 0 10*3/uL (ref 0.0–0.1)
Immature Granulocytes: 0 %
LYMPHS ABS: 1.7 10*3/uL (ref 0.7–3.1)
Lymphs: 31 %
MCH: 31.7 pg (ref 26.6–33.0)
MCHC: 35.9 g/dL — AB (ref 31.5–35.7)
MCV: 88 fL (ref 79–97)
MONOS ABS: 0.4 10*3/uL (ref 0.1–0.9)
Monocytes: 7 %
Neutrophils Absolute: 3.2 10*3/uL (ref 1.4–7.0)
Neutrophils: 57 %
Platelets: 228 10*3/uL (ref 150–379)
RBC: 4.92 x10E6/uL (ref 4.14–5.80)
RDW: 13.6 % (ref 12.3–15.4)
WBC: 5.6 10*3/uL (ref 3.4–10.8)

## 2018-01-19 LAB — LYME AB/WESTERN BLOT REFLEX: Lyme IgG/IgM Ab: <0.91 {ISR} (ref 0.00–0.90)

## 2018-02-03 ENCOUNTER — Telehealth: Payer: Self-pay | Admitting: Emergency Medicine

## 2018-02-03 NOTE — Telephone Encounter (Signed)
Copied from [place] [id number]. Topic: Quick Communication - See Telephone Encounter >> Feb 03, 2018  4:19 PM Aurelio Brash B wrote: CRM for notification. See Telephone encounter for: 02/03/18.  Pt received results via mail but needs help interrupting them

## 2018-02-04 NOTE — Telephone Encounter (Signed)
Left detailed VM advising on all lab results.

## 2018-03-10 ENCOUNTER — Telehealth: Payer: Self-pay | Admitting: Emergency Medicine

## 2018-03-10 NOTE — Telephone Encounter (Signed)
Copied from [place] [id number]. Topic: Inquiry >> Mar 10, 2018 11:39 AM Scherrie Gerlach wrote: Reason for CRM: pt needs to get a letter concerning the cysts on his ribs, for work. Pt would like a call back to discuss exactly what is needs. Dr Mitchel Honour is his pcp. Pt declined to elaborate

## 2018-03-15 NOTE — Telephone Encounter (Signed)
Spoke with Dr. Mitchel Honour advised patient we are unable to write this letter for him.    Patient stated he wanted to see Dr. Mitchel Honour.  Patient was advised he could schedule an appointment but that was no guarantee that a letter would be written for him.  Patient voiced understanding

## 2018-03-16 ENCOUNTER — Ambulatory Visit (INDEPENDENT_AMBULATORY_CARE_PROVIDER_SITE_OTHER): Payer: 59 | Admitting: Emergency Medicine

## 2018-03-16 ENCOUNTER — Other Ambulatory Visit: Payer: Self-pay

## 2018-03-16 ENCOUNTER — Encounter: Payer: Self-pay | Admitting: Emergency Medicine

## 2018-03-16 VITALS — BP 110/74 | HR 72 | Temp 98.1°F | Resp 16 | Ht 70.0 in | Wt 151.2 lb

## 2018-03-16 DIAGNOSIS — L723 Sebaceous cyst: Secondary | ICD-10-CM

## 2018-03-16 NOTE — Progress Notes (Signed)
Eugene Ryan 47 y.o.   Chief Complaint  Patient presents with  . Cyst    PER PATIENT ON LOWER RIGHT RIBCAGE x 15 years    HISTORY OF PRESENT ILLNESS: This is a 47 y.o. male requesting a letter stating that he needs to wear his car's seatbelt differently due to sebaceous cyst on his right lower anterior rib cage that occasionally causes pain when pressed on.  He has had this cyst for the past 15 years and has been medically addressed in the past. I communicated to [facility] in no uncertain terms that I would not write such a letter as I think it would put him in a risky and potentially dangerous situation.  He obviously disagrees and is not happy with my decision but I think he understands my position.  Eugene Ryan was not charged for this visit.  HPI   Prior to Admission medications   Medication Sig Start Date End Date Taking? Authorizing Provider  amphetamine-dextroamphetamine (ADDERALL) 5 MG tablet Take 1 tablet (5 mg total) by mouth daily. Patient not taking: Reported on 01/18/2018 10/29/15   Golden Circle, FNP    No Known Allergies  Patient Active Problem List   Diagnosis Date Noted  . Attention deficit hyperactivity disorder (ADHD) 10/29/2015  . Cyst of soft tissue 10/29/2015  . Tobacco use disorder 10/29/2015    Past Medical History:  Diagnosis Date  . ADHD (attention deficit hyperactivity disorder)     No past surgical history on file.  Social History   Socioeconomic History  . Marital status: Married    Spouse name: Not on file  . Number of children: 1  . Years of education: 54  . Highest education level: Not on file  Occupational History  . Occupation: Media planner  Social Needs  . Financial resource strain: Not on file  . Food insecurity:    Worry: Not on file    Inability: Not on file  . Transportation needs:    Medical: Not on file    Non-medical: Not on file  Tobacco Use  . Smoking status: Current Every Day Smoker    Packs/day: 0.75    Years:  26.00    Pack years: 19.50    Types: E-cigarettes  . Smokeless tobacco: Never Used  Substance and Sexual Activity  . Alcohol use: Yes    Alcohol/week: 7.2 oz    Types: 12 Cans of beer per week  . Drug use: No  . Sexual activity: Not on file  Lifestyle  . Physical activity:    Days per week: Not on file    Minutes per session: Not on file  . Stress: Not on file  Relationships  . Social connections:    Talks on phone: Not on file    Gets together: Not on file    Attends religious service: Not on file    Active member of club or organization: Not on file    Attends meetings of clubs or organizations: Not on file    Relationship status: Not on file  . Intimate partner violence:    Fear of current or ex partner: Not on file    Emotionally abused: Not on file    Physically abused: Not on file    Forced sexual activity: Not on file  Other Topics Concern  . Not on file  Social History Narrative  . Not on file    Family History  Adopted: Yes  Problem Relation Age of Onset  .  Schizophrenia Mother   . Post-traumatic stress disorder Father   . Lung cancer Father      ROS   Physical Exam  Constitutional: He is oriented to person, place, and time. He appears well-developed and well-nourished.  Pulmonary/Chest: Effort normal.  Neurological: He is alert and oriented to person, place, and time.  Skin:  Moderate sized nontender movable sebaceous cyst to right lower anterior rib cage.  No erythema or signs of infection.  Nontender to touch.  Psychiatric: He has a normal mood and affect. His behavior is normal.  Vitals reviewed.  Vitals:   03/16/18 1724  BP: 110/74  Pulse: 72  Resp: 16  Temp: 98.1 F (36.7 C)  SpO2: 98%     ASSESSMENT & PLAN: Eugene Ryan was seen today for cyst.  Diagnoses and all orders for this visit:  Sebaceous cyst Comments: chest    Patient Instructions       IF you received an x-ray today, you will receive an invoice from [facility]  Radiology. Please contact [facility] Radiology at [phone] with questions or concerns regarding your invoice.   IF you received labwork today, you will receive an invoice from [place]. Please contact [facility] at [phone] with questions or concerns regarding your invoice.   Our billing staff will not be able to assist you with questions regarding bills from these companies.  You will be contacted with the lab results as soon as they are available. The fastest way to get your results is to activate your My Chart account. Instructions are located on the last page of this paperwork. If you have not heard from [place] regarding the results in 2 weeks, please contact this office.     Epidermal Cyst An epidermal cyst is a small, painless lump under your skin. It may be called an epidermal inclusion cyst or an infundibular cyst. The cyst contains a grayish-white, bad-smelling substance (keratin). It is important not to pop epidermal cysts yourself. These cysts are usually harmless (benign), but they can get infected. Symptoms of infection may include:  Redness.  Inflammation.  Tenderness.  Warmth.  Fever.  A grayish-white, bad-smelling substance draining from the cyst.  Pus draining from the cyst.  Follow these instructions at home:  Take over-the-counter and prescription medicines only as told by your doctor.  If you were prescribed an antibiotic, use it as told by your doctor. Do not stop using the antibiotic even if you start to feel better.  Keep the area around your cyst clean and dry.  Wear loose, dry clothing.  Do not try to pop your cyst.  Avoid touching your cyst.  Check your cyst every day for signs of infection.  Keep all follow-up visits as told by your doctor. This is important. How is this prevented?  Wear clean, dry, clothing.  Avoid wearing tight clothing.  Keep your skin clean and dry. Shower or take baths every day.  Wash your body with a benzoyl  peroxide wash when you shower or bathe. Contact a health care provider if:  Your cyst has symptoms of infection.  Your condition is not improving or is getting worse.  You have a cyst that looks different from other cysts you have had.  You have a fever. Get help right away if:  Redness spreads from the cyst into the surrounding area. This information is not intended to replace advice given to you by your health care provider. Make sure you discuss any questions you have with your health care provider. Document  Released: 10/15/2004 Document Revised: 05/06/2016 Document Reviewed: 07/10/2015 Elsevier Interactive Patient Education  2018 [employer].      Agustina Caroli, MD [facility]

## 2018-03-16 NOTE — Patient Instructions (Addendum)
     IF you received an x-ray today, you will receive an invoice from [facility] Radiology. Please contact [facility] Radiology at [phone] with questions or concerns regarding your invoice.   IF you received labwork today, you will receive an invoice from [place]. Please contact [facility] at [phone] with questions or concerns regarding your invoice.   Our billing staff will not be able to assist you with questions regarding bills from these companies.  You will be contacted with the lab results as soon as they are available. The fastest way to get your results is to activate your My Chart account. Instructions are located on the last page of this paperwork. If you have not heard from [place] regarding the results in 2 weeks, please contact this office.     Epidermal Cyst An epidermal cyst is a small, painless lump under your skin. It may be called an epidermal inclusion cyst or an infundibular cyst. The cyst contains a grayish-white, bad-smelling substance (keratin). It is important not to pop epidermal cysts yourself. These cysts are usually harmless (benign), but they can get infected. Symptoms of infection may include:  Redness.  Inflammation.  Tenderness.  Warmth.  Fever.  A grayish-white, bad-smelling substance draining from the cyst.  Pus draining from the cyst.  Follow these instructions at home:  Take over-the-counter and prescription medicines only as told by your doctor.  If you were prescribed an antibiotic, use it as told by your doctor. Do not stop using the antibiotic even if you start to feel better.  Keep the area around your cyst clean and dry.  Wear loose, dry clothing.  Do not try to pop your cyst.  Avoid touching your cyst.  Check your cyst every day for signs of infection.  Keep all follow-up visits as told by your doctor. This is important. How is this prevented?  Wear clean, dry, clothing.  Avoid wearing tight clothing.  Keep your skin  clean and dry. Shower or take baths every day.  Wash your body with a benzoyl peroxide wash when you shower or bathe. Contact a health care provider if:  Your cyst has symptoms of infection.  Your condition is not improving or is getting worse.  You have a cyst that looks different from other cysts you have had.  You have a fever. Get help right away if:  Redness spreads from the cyst into the surrounding area. This information is not intended to replace advice given to you by your health care provider. Make sure you discuss any questions you have with your health care provider. Document Released: 10/15/2004 Document Revised: 05/06/2016 Document Reviewed: 07/10/2015 Elsevier Interactive Patient Education  [employer].

## 2022-04-01 ENCOUNTER — Ambulatory Visit: Payer: 59 | Admitting: Family Medicine

## 2022-04-01 ENCOUNTER — Encounter: Payer: Self-pay | Admitting: Family Medicine

## 2022-04-01 VITALS — BP 124/78 | HR 77 | Temp 97.5°F | Ht 69.5 in | Wt 160.8 lb

## 2022-04-01 DIAGNOSIS — W57XXXA Bitten or stung by nonvenomous insect and other nonvenomous arthropods, initial encounter: Secondary | ICD-10-CM

## 2022-04-01 DIAGNOSIS — S70361A Insect bite (nonvenomous), right thigh, initial encounter: Secondary | ICD-10-CM | POA: Diagnosis not present

## 2022-04-01 DIAGNOSIS — F909 Attention-deficit hyperactivity disorder, unspecified type: Secondary | ICD-10-CM

## 2022-04-01 DIAGNOSIS — Z0000 Encounter for general adult medical examination without abnormal findings: Secondary | ICD-10-CM | POA: Diagnosis not present

## 2022-04-01 DIAGNOSIS — Z1211 Encounter for screening for malignant neoplasm of colon: Secondary | ICD-10-CM | POA: Insufficient documentation

## 2022-04-01 DIAGNOSIS — F4310 Post-traumatic stress disorder, unspecified: Secondary | ICD-10-CM | POA: Insufficient documentation

## 2022-04-01 DIAGNOSIS — F4320 Adjustment disorder, unspecified: Secondary | ICD-10-CM | POA: Insufficient documentation

## 2022-04-01 DIAGNOSIS — Z13220 Encounter for screening for lipoid disorders: Secondary | ICD-10-CM | POA: Diagnosis not present

## 2022-04-01 DIAGNOSIS — Z125 Encounter for screening for malignant neoplasm of prostate: Secondary | ICD-10-CM | POA: Insufficient documentation

## 2022-04-01 DIAGNOSIS — Z1159 Encounter for screening for other viral diseases: Secondary | ICD-10-CM | POA: Diagnosis not present

## 2022-04-01 DIAGNOSIS — D171 Benign lipomatous neoplasm of skin and subcutaneous tissue of trunk: Secondary | ICD-10-CM | POA: Diagnosis not present

## 2022-04-01 DIAGNOSIS — Z131 Encounter for screening for diabetes mellitus: Secondary | ICD-10-CM | POA: Diagnosis not present

## 2022-04-01 DIAGNOSIS — F429 Obsessive-compulsive disorder, unspecified: Secondary | ICD-10-CM | POA: Insufficient documentation

## 2022-04-01 LAB — HEMOGLOBIN A1C: Hgb A1c MFr Bld: 5.4 % (ref 4.6–6.5)

## 2022-04-01 LAB — CBC
HCT: 45 % (ref 39.0–52.0)
Hemoglobin: 15.4 g/dL (ref 13.0–17.0)
MCHC: 34.1 g/dL (ref 30.0–36.0)
MCV: 93.1 fl (ref 78.0–100.0)
Platelets: 212 10*3/uL (ref 150.0–400.0)
RBC: 4.84 Mil/uL (ref 4.22–5.81)
RDW: 13.1 % (ref 11.5–15.5)
WBC: 3.8 10*3/uL — ABNORMAL LOW (ref 4.0–10.5)

## 2022-04-01 LAB — COMPREHENSIVE METABOLIC PANEL
ALT: 12 U/L (ref 0–53)
AST: 24 U/L (ref 0–37)
Albumin: 4.8 g/dL (ref 3.5–5.2)
Alkaline Phosphatase: 58 U/L (ref 39–117)
BUN: 13 mg/dL (ref 6–23)
CO2: 24 mEq/L (ref 19–32)
Calcium: 9.5 mg/dL (ref 8.4–10.5)
Chloride: 105 mEq/L (ref 96–112)
Creatinine, Ser: 0.8 mg/dL (ref 0.40–1.50)
GFR: 103.07 mL/min (ref >60.00)
Glucose, Bld: 91 mg/dL (ref 70–99)
Potassium: 4.3 mEq/L (ref 3.5–5.1)
Sodium: 136 mEq/L (ref 135–145)
Total Bilirubin: 0.6 mg/dL (ref 0.2–1.2)
Total Protein: 7.9 g/dL (ref 6.0–8.3)
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
## 2022-04-01 NOTE — Assessment & Plan Note (Signed)
Also on right arm Not bothering at the moment Continue monitor

## 2022-04-01 NOTE — Patient Instructions (Signed)
We will get routine screening labs We are screening for lyme disease

## 2022-04-01 NOTE — Assessment & Plan Note (Signed)
Controlled Declines Medical Intervention at This Time

## 2022-04-01 NOTE — Assessment & Plan Note (Signed)
Controlled with counseling

## 2022-04-01 NOTE — Assessment & Plan Note (Signed)
Without rash but without fevers Lyme's disease, CBC

## 2022-04-01 NOTE — Assessment & Plan Note (Signed)
Associated with recent findings associated with fathers past medical history Elevated GAD-7 and PHQ-9 Currently therapy Continue to follow

## 2022-04-02 ENCOUNTER — Telehealth: Payer: Self-pay | Admitting: Family Medicine

## 2022-04-02 LAB — LYME DISEASE SEROLOGY W/REFLEX: Lyme Total Antibody EIA: NEGATIVE

## 2022-04-02 LAB — HEPATITIS C ANTIBODY: Hepatitis C Ab: NONREACTIVE

## 2022-04-02 NOTE — Telephone Encounter (Signed)
Contacted patient and provided results. Patient verbalized understanding.  Patient scheduled on 0816/23 at 8 am with Lab.

## 2022-04-02 NOTE — Telephone Encounter (Signed)
Pt called stated that you called him about his lab results please call him at [phone]

## 2022-04-06 ENCOUNTER — Encounter: Payer: 59 | Admitting: Family Medicine

## 2022-05-06 ENCOUNTER — Other Ambulatory Visit (INDEPENDENT_AMBULATORY_CARE_PROVIDER_SITE_OTHER): Payer: 59

## 2022-05-06 DIAGNOSIS — Z125 Encounter for screening for malignant neoplasm of prostate: Secondary | ICD-10-CM

## 2022-05-06 LAB — PSA: PSA: 0.91 ng/mL (ref 0.10–4.00)

## 2022-05-06 NOTE — Addendum Note (Signed)
Addended by: Lynnea Ferrier on: 05/06/2022 08:00 AM   Modules accepted: Orders

## 2022-05-07 ENCOUNTER — Telehealth: Payer: Self-pay | Admitting: Family Medicine

## 2022-05-07 NOTE — Telephone Encounter (Signed)
Left patient a detailed voice message to return call to office.  Patient reviewed results via mychart.

## 2022-05-07 NOTE — Telephone Encounter (Signed)
Caller Name: Robbie Rideaux Call back phone #: [phone]  Reason for Call: Received a call to go over test results, please return call

## 2022-07-23 DIAGNOSIS — Z135 Encounter for screening for eye and ear disorders: Secondary | ICD-10-CM | POA: Diagnosis not present

## 2022-07-23 DIAGNOSIS — H5203 Hypermetropia, bilateral: Secondary | ICD-10-CM | POA: Diagnosis not present

## 2022-07-23 DIAGNOSIS — H524 Presbyopia: Secondary | ICD-10-CM | POA: Diagnosis not present

## 2022-07-23 DIAGNOSIS — H52223 Regular astigmatism, bilateral: Secondary | ICD-10-CM | POA: Diagnosis not present

## 2023-04-08 ENCOUNTER — Telehealth: Payer: Self-pay | Admitting: Family Medicine

## 2023-04-08 ENCOUNTER — Encounter: Payer: 59 | Admitting: Family Medicine

## 2023-04-08 NOTE — Telephone Encounter (Signed)
7.18.24 no show letter sent

## 2023-04-14 NOTE — Telephone Encounter (Signed)
1st missed visit, same day cancel due to work schedule

## 2023-04-29 ENCOUNTER — Encounter: Payer: Self-pay | Admitting: Family Medicine

## 2023-04-29 ENCOUNTER — Ambulatory Visit (INDEPENDENT_AMBULATORY_CARE_PROVIDER_SITE_OTHER): Payer: 59 | Admitting: Nurse Practitioner

## 2023-04-29 VITALS — BP 112/72 | HR 79 | Temp 97.9°F | Ht 69.5 in | Wt 162.6 lb

## 2023-04-29 DIAGNOSIS — Z0000 Encounter for general adult medical examination without abnormal findings: Secondary | ICD-10-CM

## 2023-04-29 DIAGNOSIS — L989 Disorder of the skin and subcutaneous tissue, unspecified: Secondary | ICD-10-CM

## 2023-04-29 DIAGNOSIS — F17200 Nicotine dependence, unspecified, uncomplicated: Secondary | ICD-10-CM | POA: Diagnosis not present

## 2023-04-29 DIAGNOSIS — F902 Attention-deficit hyperactivity disorder, combined type: Secondary | ICD-10-CM | POA: Diagnosis not present

## 2023-04-29 DIAGNOSIS — F4323 Adjustment disorder with mixed anxiety and depressed mood: Secondary | ICD-10-CM | POA: Diagnosis not present

## 2023-04-29 NOTE — Progress Notes (Signed)
BP 112/72 (BP Location: Left Arm, Patient Position: Sitting, Cuff Size: Large)   Pulse 79   Temp 97.9 F (36.6 C) (Temporal)   Ht 5' 9.5" (1.765 m)   Wt 162 lb 9.6 oz (73.8 kg)   SpO2 98%   BMI 23.67 kg/m    Subjective:    Patient ID: Eugene Ryan, male    DOB: July 21, 1971, 52 y.o.   MRN: [id number]  CC: Chief Complaint  Patient presents with   Annual Exam    Fasting    HPI: Eugene Ryan is a 52 y.o. male presenting on 04/29/2023 for comprehensive medical examination. Current medical complaints include: stress, anxiety, insomnia  He has been having an increase in anxiety, stress, and insomnia due to recent separation from his spouse.  He also notes that he may be changing jobs and is hoping to move out of state.  He has been able to talk with friends.  He has been trying over-the-counter melatonin, Ashwaganda, valerian root.  He also notes a skin tag on his left thigh.  He states that it has been there for about a year and is slowly getting bigger and changing colors.  It does not cause any pain.  He would like a referral to dermatology.  Depression and Anxiety Screen done today and results listed below:     04/29/2023    9:13 AM 04/01/2022    9:28 AM 03/16/2018    5:24 PM 01/18/2018    4:48 PM  Depression screen PHQ 2/9  Decreased Interest 1 1 1  0  Down, Depressed, Hopeless 2 1 1  0  PHQ - 2 Score 3 2 2  0  Altered sleeping 2 2 2    Tired, decreased energy 2 0 0   Change in appetite 2 1 2    Feeling bad or failure about yourself  2 2 0   Trouble concentrating 2 1 0   Moving slowly or fidgety/restless 2 3 0   Suicidal thoughts 0 1 0   PHQ-9 Score 15 12 6    Difficult doing work/chores Somewhat difficult Not difficult at all        04/29/2023    9:14 AM 04/01/2022    9:28 AM  GAD 7 : Generalized Anxiety Score  Nervous, Anxious, on Edge 2 1  Control/stop worrying 2 2  Worry too much - different things 2 3  Trouble relaxing 3 2  Restless 3 2  Easily annoyed or  irritable 2 2  Afraid - awful might happen 2 2  Total GAD 7 Score 16 14  Anxiety Difficulty Somewhat difficult Somewhat difficult    The patient does not have a history of falls. I did not complete a risk assessment for falls. A plan of care for falls was not documented.   Past Medical History:  Past Medical History:  Diagnosis Date   ADHD (attention deficit hyperactivity disorder)     Surgical History:  History reviewed. No pertinent surgical history.  Medications:  No current outpatient medications on file prior to visit.   No current facility-administered medications on file prior to visit.    Allergies:  No Known Allergies  Social History:  Social History   Socioeconomic History   Marital status: Legally Separated    Spouse name: Not on file   Number of children: 1   Years of education: 14   Highest education level: Not on file  Occupational History   Occupation: Furniture conservator/restorer  Tobacco Use   Smoking status: Former  Current packs/day: 0.00    Average packs/day: 0.8 packs/day for 26.0 years (19.5 ttl pk-yrs)    Types: E-cigarettes, Cigarettes    Start date: 25    Quit date: 2009    Years since quitting: 15.6   Smokeless tobacco: Never  Vaping Use   Vaping status: Every Day  Substance and Sexual Activity   Alcohol use: Yes    Alcohol/week: 14.0 standard drinks of alcohol    Types: 14 Cans of beer per week   Drug use: No   Sexual activity: Yes  Other Topics Concern   Not on file  Social History Narrative   Not on file   Social Determinants of Health   Financial Resource Strain: Not on file  Food Insecurity: Not on file  Transportation Needs: Not on file  Physical Activity: Not on file  Stress: Not on file  Social Connections: Not on file  Intimate Partner Violence: Not on file   Social History   Tobacco Use  Smoking Status Former   Current packs/day: 0.00   Average packs/day: 0.8 packs/day for 26.0 years (19.5 ttl pk-yrs)   Types:  E-cigarettes, Cigarettes   Start date: 20   Quit date: 2009   Years since quitting: 15.6  Smokeless Tobacco Never   Social History   Substance and Sexual Activity  Alcohol Use Yes   Alcohol/week: 14.0 standard drinks of alcohol   Types: 14 Cans of beer per week    Family History:  Family History  Adopted: Yes  Problem Relation Age of Onset   Schizophrenia Mother    Post-traumatic stress disorder Father    Lung cancer Father    Autoimmune disease Sister     Past medical history, surgical history, medications, allergies, family history and social history reviewed with patient today and changes made to appropriate areas of the chart.   Review of Systems  Constitutional:  Positive for malaise/fatigue. Negative for fever.  HENT: Negative.    Eyes:  Positive for blurred vision.  Respiratory: Negative.    Cardiovascular: Negative.   Gastrointestinal: Negative.   Genitourinary: Negative.   Musculoskeletal: Negative.   Skin:        Skin lesion to left thigh  Neurological: Negative.   Psychiatric/Behavioral:  Positive for depression. The patient is nervous/anxious and has insomnia.    All other ROS negative except what is listed above and in the HPI.      Objective:    BP 112/72 (BP Location: Left Arm, Patient Position: Sitting, Cuff Size: Large)   Pulse 79   Temp 97.9 F (36.6 C) (Temporal)   Ht 5' 9.5" (1.765 m)   Wt 162 lb 9.6 oz (73.8 kg)   SpO2 98%   BMI 23.67 kg/m   Wt Readings from Last 3 Encounters:  04/29/23 162 lb 9.6 oz (73.8 kg)  04/01/22 160 lb 12.8 oz (72.9 kg)  03/16/18 151 lb 3.2 oz (68.6 kg)    Physical Exam Vitals and nursing note reviewed.  Constitutional:      General: He is not in acute distress.    Appearance: Normal appearance.  HENT:     Head: Normocephalic and atraumatic.     Right Ear: Tympanic membrane, ear canal and external ear normal.     Left Ear: Tympanic membrane, ear canal and external ear normal.  Eyes:      Conjunctiva/sclera: Conjunctivae normal.  Cardiovascular:     Rate and Rhythm: Normal rate and regular rhythm.     Pulses: Normal pulses.  Heart sounds: Normal heart sounds.  Pulmonary:     Effort: Pulmonary effort is normal.     Breath sounds: Normal breath sounds.  Abdominal:     Palpations: Abdomen is soft.     Tenderness: There is no abdominal tenderness.  Musculoskeletal:        General: Normal range of motion.     Cervical back: Normal range of motion and neck supple. No tenderness.     Right lower leg: No edema.     Left lower leg: No edema.  Lymphadenopathy:     Cervical: No cervical adenopathy.  Skin:    General: Skin is warm and dry.     Comments: Cyst noted to left thigh ~0.5cm in diameter  Neurological:     General: No focal deficit present.     Mental Status: He is alert and oriented to person, place, and time.     Cranial Nerves: No cranial nerve deficit.     Coordination: Coordination normal.     Gait: Gait normal.  Psychiatric:        Mood and Affect: Mood normal.        Behavior: Behavior normal.        Thought Content: Thought content normal.        Judgment: Judgment normal.     Results for orders placed or performed in visit on 05/06/22  PSA  Result Value Ref Range   PSA 0.91 0.10 - 4.00 ng/mL      Assessment & Plan:   Problem List Items Addressed This Visit       Other   Attention deficit hyperactivity disorder (ADHD)    Chronic, stable.  Controlled with yoga and meditation.      Tobacco use disorder    He is currently vaping daily.  Recommend complete tobacco cessation.      Adjustment disorder    He is having an acute episode of situational depression and anxiety, along with insomnia.  He is getting divorced, possibly changing jobs, and possibly moving out of state.  His PHQ-9 is a 15 and his GAD-7 is a 16.  He denies SI/HI.  He is interested in counseling with a provider who is competent in adoption.  He declines medication at this  time.  He can continue over-the-counter melatonin, Ashwaganda, or valerian root as needed.  Encouraged him to reach out with any worsening symptoms or concerns.      Relevant Orders   Ambulatory referral to Psychology   Routine general medical examination at a health care facility - Primary    Health maintenance reviewed and updated. Discussed nutrition, exercise.  Last year CMP, CBC, PSA were all normal.  Will hold off on labs this year and repeat next year.  Follow-up 1 year.        Other Visit Diagnoses     Skin lesion       Possible epidermal cyst noted to left thigh. Will place referral to dermatology per patient request.   Relevant Orders   Ambulatory referral to Dermatology        LABORATORY TESTING:  Health maintenance labs ordered today as discussed above.   PSA was checked last year and was 0.91   IMMUNIZATIONS:   - Tdap: Tetanus vaccination status reviewed: last tetanus booster within 10 years. - Influenza: Postponed to flu season - Pneumovax: Not applicable - Prevnar: Not applicable - HPV: Not applicable - Zostavax vaccine:  Declined  SCREENING: - Colonoscopy:  declined   Discussed  with patient purpose of the colonoscopy is to detect colon cancer at curable precancerous or early stages   - AAA Screening: Not applicable   PATIENT COUNSELING:    Sexuality: Discussed sexually transmitted diseases, partner selection, use of condoms, avoidance of unintended pregnancy  and contraceptive alternatives.   Advised to avoid cigarette smoking.  I discussed with the patient that most people either abstain from alcohol or drink within safe limits (<=14/week and <=4 drinks/occasion for males, <=7/weeks and <= 3 drinks/occasion for females) and that the risk for alcohol disorders and other health effects rises proportionally with the number of drinks per week and how often a drinker exceeds daily limits.  Discussed cessation/primary prevention of drug use and availability  of treatment for abuse.   Diet: Encouraged to adjust caloric intake to maintain  or achieve ideal body weight, to reduce intake of dietary saturated fat and total fat, to limit sodium intake by avoiding high sodium foods and not adding table salt, and to maintain adequate dietary potassium and calcium preferably from fresh fruits, vegetables, and low-fat dairy products.    stressed the importance of regular exercise  Injury prevention: Discussed safety belts, safety helmets, smoke detector, smoking near bedding or upholstery.   Dental health: Discussed importance of regular tooth brushing, flossing, and dental visits.   Follow up plan: NEXT PREVENTATIVE PHYSICAL DUE IN 1 YEAR. Return in about 1 year (around 04/28/2024) for CPE.  Larnce Schnackenberg A Nasir Bright

## 2023-04-29 NOTE — Patient Instructions (Signed)
It was great to see you!  I am placing a referral to dermatology and a therapist.   Let's follow-up in 1 year, sooner if you have concerns.  If a referral was placed today, you will be contacted for an appointment. Please note that routine referrals can sometimes take up to 3-4 weeks to process. Please call our office if you haven't heard anything after this time frame.  Take care,  Rodman Pickle, NP

## 2023-04-29 NOTE — Assessment & Plan Note (Signed)
Health maintenance reviewed and updated. Discussed nutrition, exercise.  Last year CMP, CBC, PSA were all normal.  Will hold off on labs this year and repeat next year.  Follow-up 1 year.

## 2023-04-29 NOTE — Assessment & Plan Note (Signed)
He is having an acute episode of situational depression and anxiety, along with insomnia.  He is getting divorced, possibly changing jobs, and possibly moving out of state.  His PHQ-9 is a 15 and his GAD-7 is a 16.  He denies SI/HI.  He is interested in counseling with a provider who is competent in adoption.  He declines medication at this time.  He can continue over-the-counter melatonin, Ashwaganda, or valerian root as needed.  Encouraged him to reach out with any worsening symptoms or concerns.

## 2023-04-29 NOTE — Assessment & Plan Note (Signed)
He is currently vaping daily.  Recommend complete tobacco cessation.

## 2023-04-29 NOTE — Assessment & Plan Note (Signed)
Chronic, stable.  Controlled with yoga and meditation.

## 2023-05-21 DIAGNOSIS — F4322 Adjustment disorder with anxiety: Secondary | ICD-10-CM | POA: Diagnosis not present

## 2023-05-25 DIAGNOSIS — F4310 Post-traumatic stress disorder, unspecified: Secondary | ICD-10-CM | POA: Diagnosis not present

## 2023-06-08 DIAGNOSIS — F4310 Post-traumatic stress disorder, unspecified: Secondary | ICD-10-CM | POA: Diagnosis not present

## 2023-06-29 DIAGNOSIS — F4310 Post-traumatic stress disorder, unspecified: Secondary | ICD-10-CM | POA: Diagnosis not present

## 2023-07-08 DIAGNOSIS — F4310 Post-traumatic stress disorder, unspecified: Secondary | ICD-10-CM | POA: Diagnosis not present

## 2023-07-14 DIAGNOSIS — F4310 Post-traumatic stress disorder, unspecified: Secondary | ICD-10-CM | POA: Diagnosis not present

## 2023-07-21 DIAGNOSIS — F4310 Post-traumatic stress disorder, unspecified: Secondary | ICD-10-CM | POA: Diagnosis not present

## 2023-08-05 DIAGNOSIS — F4310 Post-traumatic stress disorder, unspecified: Secondary | ICD-10-CM | POA: Diagnosis not present

## 2024-05-03 ENCOUNTER — Encounter: Admitting: Nurse Practitioner

## 2024-05-10 ENCOUNTER — Encounter: Admitting: Family Medicine

## 2024-05-11 ENCOUNTER — Encounter: Payer: Self-pay | Admitting: Family Medicine
